# Patient Record
Sex: Female | Born: 1975 | Race: White | Hispanic: No | Marital: Married | Smoking: Former smoker
Health system: Southern US, Community
[De-identification: ages and names within clinical notes are randomized; demographics above are authoritative.]

## PROBLEM LIST (undated history)

## (undated) DIAGNOSIS — R61 Generalized hyperhidrosis: Secondary | ICD-10-CM

## (undated) DIAGNOSIS — B001 Herpesviral vesicular dermatitis: Secondary | ICD-10-CM

## (undated) DIAGNOSIS — N390 Urinary tract infection, site not specified: Secondary | ICD-10-CM

## (undated) HISTORY — DX: Urinary tract infection, site not specified: N39.0

## (undated) HISTORY — DX: Herpesviral vesicular dermatitis: B00.1

## (undated) HISTORY — DX: Generalized hyperhidrosis: R61

## (undated) HISTORY — PX: BREAST EXCISIONAL BIOPSY: SUR124

---

## 2004-02-26 ENCOUNTER — Inpatient Hospital Stay (HOSPITAL_COMMUNITY): Admission: AD | Admit: 2004-02-26 | Discharge: 2004-02-26 | Payer: Self-pay | Admitting: Obstetrics and Gynecology

## 2004-03-03 ENCOUNTER — Inpatient Hospital Stay (HOSPITAL_COMMUNITY): Admission: AD | Admit: 2004-03-03 | Discharge: 2004-03-06 | Payer: Self-pay | Admitting: Obstetrics and Gynecology

## 2004-03-07 ENCOUNTER — Encounter: Admission: RE | Admit: 2004-03-07 | Discharge: 2004-04-06 | Payer: Self-pay | Admitting: Obstetrics and Gynecology

## 2013-01-03 ENCOUNTER — Encounter: Payer: Self-pay | Admitting: Gastroenterology

## 2013-01-22 ENCOUNTER — Ambulatory Visit (INDEPENDENT_AMBULATORY_CARE_PROVIDER_SITE_OTHER): Payer: BC Managed Care – PPO | Admitting: Gastroenterology

## 2013-01-22 ENCOUNTER — Encounter: Payer: Self-pay | Admitting: Gastroenterology

## 2013-01-22 VITALS — BP 110/70 | HR 78 | Ht 65.5 in | Wt 144.1 lb

## 2013-01-22 DIAGNOSIS — Z800 Family history of malignant neoplasm of digestive organs: Secondary | ICD-10-CM

## 2013-01-22 NOTE — Progress Notes (Signed)
HPI: This is a   very pleasant 37 year old woman whom I am meeting for the first time today.  Mother had rectal cancer.  She survived.  She has had no GI symptoms. No concerning changes with bowel.  No overt GI bleeding. Overall her weight has been up a bit.  No abdominal  surgeries  Review of systems: Pertinent positive and negative review of systems were noted in the above HPI section. Complete review of systems was performed and was otherwise normal.    Past Medical History  Diagnosis Date  . UTI (lower urinary tract infection)   . Cold sore   . Night sweats     History reviewed. No pertinent past surgical history.  Current Outpatient Prescriptions  Medication Sig Dispense Refill  . Multiple Vitamin (MULTIVITAMIN) tablet Take 1 tablet by mouth daily.      . Norethindrone-Ethinyl Estradiol-Fe Biphas (LO LOESTRIN FE) 1 MG-10 MCG / 10 MCG tablet Take 1 tablet by mouth daily.       No current facility-administered medications for this visit.    Allergies as of 01/22/2013  . (No Known Allergies)    Family History  Problem Relation Age of Onset  . Rectal cancer Mother 48  . Tuberculosis Mother   . Stomach cancer      maternal ?    History   Social History  . Marital Status: Married    Spouse Name: N/A    Number of Children: 1  . Years of Education: N/A   Occupational History  .     Social History Main Topics  . Smoking status: Former Smoker -- 0.50 packs/day    Types: Cigarettes    Quit date: 07/25/2012  . Smokeless tobacco: Never Used  . Alcohol Use: No     Comment: occasionally  . Drug Use: No  . Sexually Active: Not on file   Other Topics Concern  . Not on file   Social History Narrative  . No narrative on file       Physical Exam: BP 110/70  Pulse 78  Ht 5' 5.5" (1.664 m)  Wt 144 lb 2 oz (65.375 kg)  BMI 23.61 kg/m2 Constitutional: generally well-appearing Psychiatric: alert and oriented x3 Eyes: extraocular movements intact Mouth:  oral pharynx moist, no lesions Neck: supple no lymphadenopathy Cardiovascular: heart regular rate and rhythm Lungs: clear to auscultation bilaterally Abdomen: soft, nontender, nondistended, no obvious ascites, no peritoneal signs, normal bowel sounds Extremities: no lower extremity edema bilaterally Skin: no lesions on visible extremities    Assessment and plan: 37 y.o. female with  elevated risk for colorectal cancer given family history of rectal cancer in mother who was diagnosed in her early 56s.  National guidelines recommend colon cancer screening should start at age 60 or 10 years prior to the diagnosis of colon cancer in her first degree relative, whichever is sooner. She has no GI symptoms that warrant sooner evaluation and she is more than happy to wait until she turns 45 colonoscopy. She knows if she has any significant issues with her about bleeding, significant changes to call sooner than. Otherwise we will put her in our reminder system contact her around age 18.

## 2013-01-22 NOTE — Patient Instructions (Addendum)
You should have screening colonoscopy at age 37 for FH of colon/rectal cancer.  Will put you information in our recall system. If any GI bleeding, significant bowel changes please contact Dr. Christella Hartigan. A copy of this information will be made available to Dr. Vincente Poli.                                               We are excited to introduce MyChart, a new best-in-class service that provides you online access to important information in your electronic medical record. We want to make it easier for you to view your health information - all in one secure location - when and where you need it. We expect MyChart will enhance the quality of care and service we provide.  When you register for MyChart, you can:    View your test results.    Request appointments and receive appointment reminders via email.    Request medication renewals.    View your medical history, allergies, medications and immunizations.    Communicate with your physician's office through a password-protected site.    Conveniently print information such as your medication lists.  To find out if MyChart is right for you, please talk to a member of our clinical staff today. We will gladly answer your questions about this free health and wellness tool.  If you are age 73 or older and want a member of your family to have access to your record, you must provide written consent by completing a proxy form available at our office. Please speak to our clinical staff about guidelines regarding accounts for patients younger than age 83.  As you activate your MyChart account and need any technical assistance, please call the MyChart technical support line at [phone]) or email your question to [email or web address]. If you email your question(s), please include your name, a return phone number and the best time to reach you.  If you have non-urgent health-related questions, you can send a message to our office through  MyChart at [email or web address]. If you have a medical emergency, call 911.  Thank you for using MyChart as your new health and wellness resource!   MyChart licensed from [employer],  4540-9811. Patents Pending.

## 2017-09-25 ENCOUNTER — Encounter: Payer: Self-pay | Admitting: Gastroenterology

## 2017-11-15 ENCOUNTER — Ambulatory Visit: Payer: BC Managed Care – PPO | Admitting: Family Medicine

## 2017-11-15 ENCOUNTER — Encounter: Payer: Self-pay | Admitting: Family Medicine

## 2017-11-15 ENCOUNTER — Telehealth: Payer: Self-pay | Admitting: Family Medicine

## 2017-11-15 ENCOUNTER — Ambulatory Visit
Admission: RE | Admit: 2017-11-15 | Discharge: 2017-11-15 | Disposition: A | Discharge status: Home / Self Care | Payer: BC Managed Care – PPO | Source: Ambulatory Visit | Attending: Family Medicine | Admitting: Family Medicine

## 2017-11-15 VITALS — BP 108/78 | Ht 65.0 in | Wt 145.0 lb

## 2017-11-15 DIAGNOSIS — G8929 Other chronic pain: Secondary | ICD-10-CM

## 2017-11-15 DIAGNOSIS — M25571 Pain in right ankle and joints of right foot: Principal | ICD-10-CM

## 2017-11-15 MED ORDER — MELOXICAM 15 MG PO TABS: 15.0000 mg | ORAL_TABLET | Freq: Every day | ORAL | 0 refills | Status: AC

## 2017-11-15 NOTE — Progress Notes (Signed)
Chief complaint: Right ankle pain 1 week, increase in pain 2 days  History of present illness: Connie Wallace is a 42 year old female who presents to the sports medicine office today with chief complaint of right ankle pain. She reports that symptoms initially started about a week ago. She does not report of any specific inciting incident, trauma, or injury to explain the pain. She reports that initially she started noticing a crunching sensation in her ankle when she walks. She reports noticing it mainly deep inside her ankle joint and on the lateral side. She reports she does work out with a Systems analyst once weekly. No specific increase in activity otherwise. 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 She reports 2 days ago while she was standing up she suddenly noticed a popping sensation in her right ankle, described it as a "pulling apart of guitar strings". She reports immediately feeling pain in her right anterior and lateral ankle and had difficulty bearing weight due to pain. She reports that she tried moving it around for a few minutes and then all of a sudden it "popped back into place". She has noticed slight increased swelling in the right lateral ankle. She does not report of any type of inversion, eversion, dorsiflexion, or plantarflexion trauma. She reports only ankle or foot history she has had the past was base of fifth metatarsal fracture on the right side 10 years ago, reports that she was in a boot for 6 weeks. She does not report of any numbness, tingling, or burning paresthesias. She reports pain on both the lateral and medial side of her right ankle, as well as anterior right ankle over the talus. She reports pain with dorsiflexion, plantar flexion, inversion, and eversion. No previous history of ankle dislocation. She reports that she did see her primary physician yesterday, who kindly referred her here for further  evaluation.  Review of systems:  As stated above  Her past medical history, surgical history, family history, and social history obtained and reviewed. She is otherwise healthy, no medical conditions, no surgeries involving her right foot or ankle, family history is noncontributory to chief complaint, family history of rectal cancer and stomach cancer, she does not report of any current tobacco use.  Physical exam: Vital signs are reviewed and are documented in the chart Gen.: Alert, oriented, appears stated age, in no apparent distress HEENT: Moist oral mucosa Respiratory: Normal respirations, able to speak in full sentences Cardiac: Regular rate, distal pulses 2+ Integumentary: No rashes on visible skin:  Neurologic: Strength 5/5, sensation 2+ in bilateral feet and ankle Psych: Normal affect, mood is described as good Musculoskeletal: Inspection of right foot and ankle reveal no obvious deformity or muscle atrophy, she does have a moderate amount of effusion on the lateral aspect of the right ankle, no warmth, erythema, ecchymosis noted, she is tender multiple areas of the right ankle including the anterior lateral malleolus, anterior ankle over the talus, and over the medial ankle near the posterior tibialis tendon, does have pain with dorsiflexion, plantar flexion, inversion, eversion to does have full range of motion, no signs of ligamentous instability, anterior drawer and talar tilt negative  Limited musculoskeletal ultrasound was performed in the office today of his right foot and ankle. She does have some hypoechoic echogenicity noted along the peroneal tendons right near the lateral malleolus, has also got a notable effusion intra-articular between the tibia and talus,  is also slight amount hypoechogenicity seen in the posterior tibialis tendon near the medial malleolus  Assessment and plan: 1. Acute right ankle pain 2 days 2. History of right base of fifth metatarsal fracture 10  years ago  Plan: Given the diffuse pain that she is having, as well as effusion noted on ultrasound in the tibiotalar joint, question whether her symptoms may be intra-articular. 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 She lacks the trauma that would be expected with a ligament strain, but this very well could be some sort a ligament strain or of the peroneal tendon and/or posterior tibialis tendon. We'll go ahead and order for x-ray of her right ankle for further evaluation of this. We'll obtain 3 views to include AP, lateral, and oblique. I do think we need to calm things down, thus she will be fitted in CAM walker boot for the next week. Additionally, we'll start her on meloxicam 15 mg daily. She will follow-up here in one week for reevaluation or sooner as needed. I will call her after x-ray results.   Haynes Kernshristopher Arnetia Bronk, M.D. Primary Care Sports Medicine Fellow [facility]

## 2017-11-15 NOTE — Addendum Note (Signed)
Addended by: Rutha BouchardBABNIK, Indria Bishara E on: 11/15/2017 01:36 PM   Modules accepted: Orders

## 2017-11-15 NOTE — Telephone Encounter (Signed)
I called Connie Wallace at 1400 on 11/15/17 to relay XR results of her right ankle. There appears to be an area of heterogenous bone density and slight degradation noted in the anterolateral talus. A definite donor site is not observed. I discussed with her that this does raise my concern regarding this being ankle OCD (specifically of the talus) and that next best step would be to go ahead and order a MRI of her right ankle now for further evaluation of this area in the right anterolateral talus. I discussed this does dictate further management in terms of conservative versus arthroscopic repair. She is agreeable to this being done. MRI of right ankle is ordered. She did have a few questions regarding OCD and all questions were satisfactorily answered. She will follow up here in the office after having the MRI. She is still to be in CAM walker boot until that time.  Haynes Kernshristopher Sharel Behne, MD Primary Care Sports Medicine Fellow [facility]

## 2017-11-20 ENCOUNTER — Ambulatory Visit
Admission: RE | Admit: 2017-11-20 | Discharge: 2017-11-20 | Disposition: A | Discharge status: Home / Self Care | Payer: BC Managed Care – PPO | Source: Ambulatory Visit | Attending: Family Medicine | Admitting: Family Medicine

## 2017-11-20 DIAGNOSIS — G8929 Other chronic pain: Secondary | ICD-10-CM

## 2017-11-20 DIAGNOSIS — M25571 Pain in right ankle and joints of right foot: Principal | ICD-10-CM

## 2017-11-22 ENCOUNTER — Ambulatory Visit: Payer: BC Managed Care – PPO | Admitting: Family Medicine

## 2017-11-22 ENCOUNTER — Encounter: Payer: Self-pay | Admitting: Family Medicine

## 2017-11-22 VITALS — BP 102/70 | Ht 65.0 in | Wt 145.0 lb

## 2017-11-22 DIAGNOSIS — G8929 Other chronic pain: Secondary | ICD-10-CM | POA: Diagnosis not present

## 2017-11-22 DIAGNOSIS — M25571 Pain in right ankle and joints of right foot: Secondary | ICD-10-CM

## 2017-11-22 NOTE — Progress Notes (Signed)
Chief complaint: Follow-up of right ankle pain 2 weeks, reviewing MRI results of her right ankle today  History of present illness: Connie Wallace is a 42 year old female who presents to the sports medicine office today for follow-up of right ankle pain 2 weeks. She just recently had an MRI done 2 days ago. At last appointment, she has been and cam walker boot. She reports that symptoms have improved, has not noticed the pain and swelling. She does not report of any interval injury or trauma. No numbness, tingling, or burning paresthesias.  Review of systems:  As stated above  Interval past medical history, surgical history, family history, and social history obtained and unchanged.  Physical exam: Vital signs are reviewed and are documented in the chart Gen.: Alert, oriented, appears stated age, in no apparent distress HEENT: Moist oral mucosa Respiratory: Normal respirations, able to speak in full sentences Cardiac: Regular rate, distal pulses 2+ Integumentary: No rashes on visible skin:  Neurologic: Strength is intact in bilateral ankles and feet, sensation is intact in bilateral ankles and feet Psych: Normal affect, mood is described as good Musculoskeletal: Inspection of right foot and ankle reveal no obvious deformity or muscle atrophy, she effusion size on the lateral aspect of the right ankle has decreased significantly, still trace amount today, no warmth, erythema, or ecchymosis noted, no tenderness today over the talus or posterior tibialis tendon  Assessment and plan: 1. 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 Right posterior tibialis tendonopathy 3. History of right base of fifth metatarsal fracture which has healed  Plan: I did personally review the MRI images with Connie Wallace in the office today. Ultimately, I discussed with her that based on these images she will need to  have ankle arthroscopy. Discussed microfracture possibility and debridement of necrotic bone at time of arthroscopy if it is necessary. I discussed with her that this will probably be a chronic issue for her if this does not get addressed surgically. Discussed referral to one of my surgical partners, Dr[facility] Hurshel [facility]Pete [facility]. He is agreeable to this. Appointment was made for her to see Dr. Jerl Santosalldorf today at 3:15. I also gave her copy of MRI report.   Connie Wallace, M.D. Primary Care Sports Medicine Fellow [facility]

## 2017-11-22 NOTE — Patient Instructions (Signed)
Dr. Myrna Blazeralldorf [facility] [address] [place] [phone]  Appt: 11/22/17 at 3:15 pm, please arrive at 2:45 pm

## 2018-03-01 ENCOUNTER — Other Ambulatory Visit: Payer: Self-pay | Admitting: Obstetrics and Gynecology

## 2018-03-01 DIAGNOSIS — R928 Other abnormal and inconclusive findings on diagnostic imaging of breast: Secondary | ICD-10-CM

## 2018-03-05 ENCOUNTER — Ambulatory Visit
Admission: RE | Admit: 2018-03-05 | Discharge: 2018-03-05 | Disposition: A | Discharge status: Home / Self Care | Payer: BC Managed Care – PPO | Source: Ambulatory Visit | Attending: Obstetrics and Gynecology | Admitting: Obstetrics and Gynecology

## 2018-03-05 ENCOUNTER — Other Ambulatory Visit: Payer: Self-pay | Admitting: Obstetrics and Gynecology

## 2018-03-05 DIAGNOSIS — R928 Other abnormal and inconclusive findings on diagnostic imaging of breast: Secondary | ICD-10-CM

## 2018-03-06 ENCOUNTER — Other Ambulatory Visit: Payer: Self-pay | Admitting: Obstetrics and Gynecology

## 2018-03-06 DIAGNOSIS — R928 Other abnormal and inconclusive findings on diagnostic imaging of breast: Secondary | ICD-10-CM

## 2018-03-23 ENCOUNTER — Ambulatory Visit
Admission: RE | Admit: 2018-03-23 | Discharge: 2018-03-23 | Disposition: A | Discharge status: Home / Self Care | Payer: BC Managed Care – PPO | Source: Ambulatory Visit | Attending: Obstetrics and Gynecology | Admitting: Obstetrics and Gynecology

## 2018-03-23 DIAGNOSIS — R928 Other abnormal and inconclusive findings on diagnostic imaging of breast: Secondary | ICD-10-CM

## 2018-03-23 MED ORDER — GADOBENATE DIMEGLUMINE 529 MG/ML IV SOLN
13.0000 mL | Freq: Once | INTRAVENOUS | Status: AC | PRN
  Administered 2018-03-23: 13 mL via INTRAVENOUS

## 2019-01-31 IMAGING — MG DIGITAL DIAGNOSTIC UNILATERAL LEFT MAMMOGRAM WITH TOMO AND CAD
6 of 10 series · 6 of 30 positions shown · non-contrast
Comparison: Previous exam(s).

CLINICAL DATA: 41-year-old female for evaluation of possible LEFT
breast asymmetry. Please note that on the screening study, this was
inadvertently labeled as of possible RIGHT breast asymmetry.

EXAM:
DIGITAL DIAGNOSTIC LEFT MAMMOGRAM WITH CAD AND TOMO
ULTRASOUND LEFT BREAST

[L XCCL synth-2D (1 of 2)]
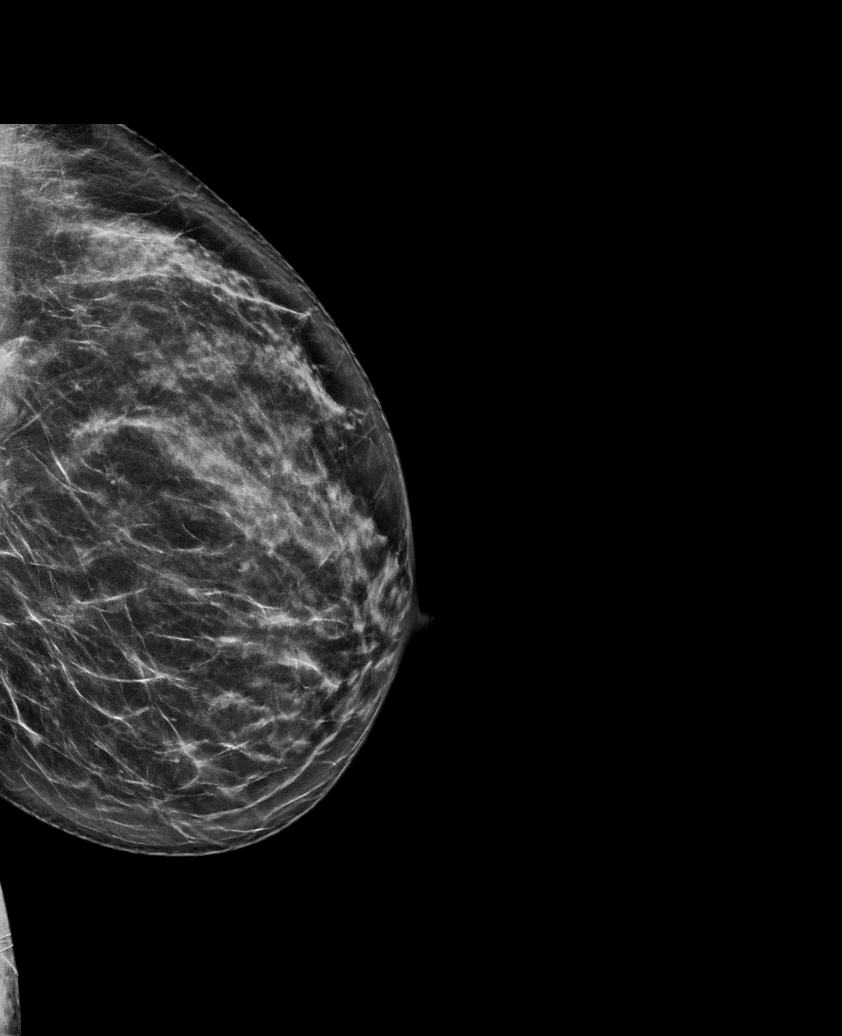

[L XCCM synth-2D]
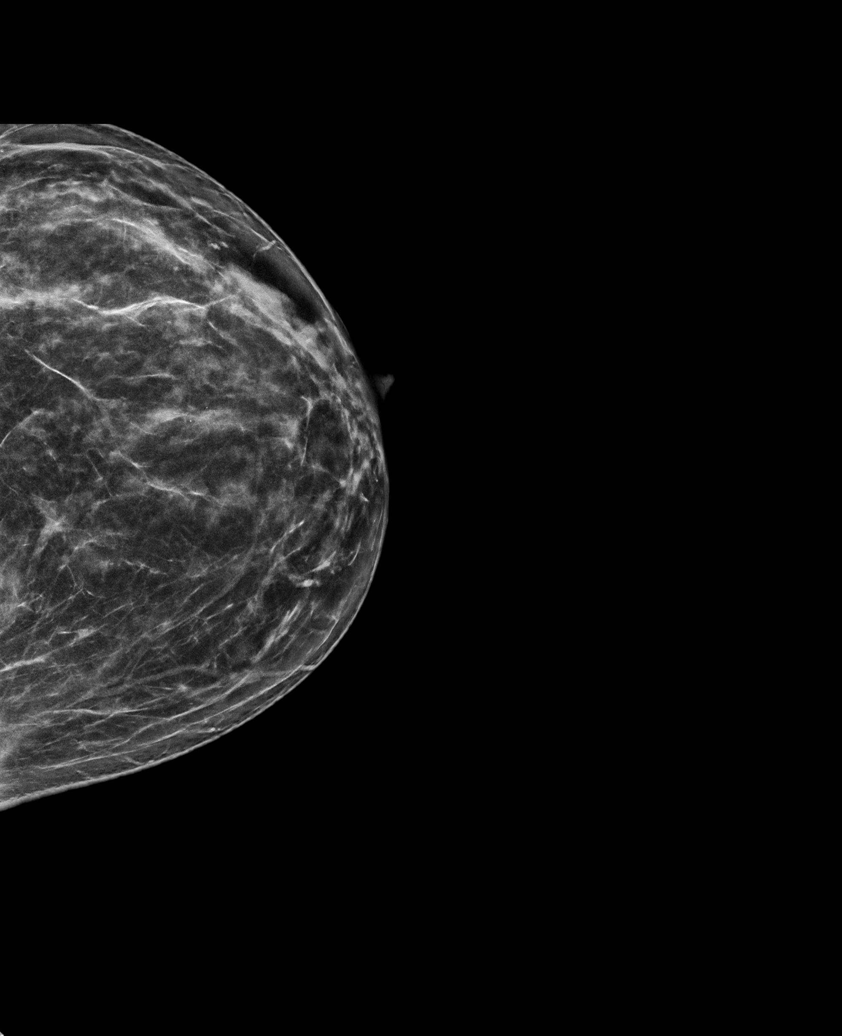

[L ML synth-2D]
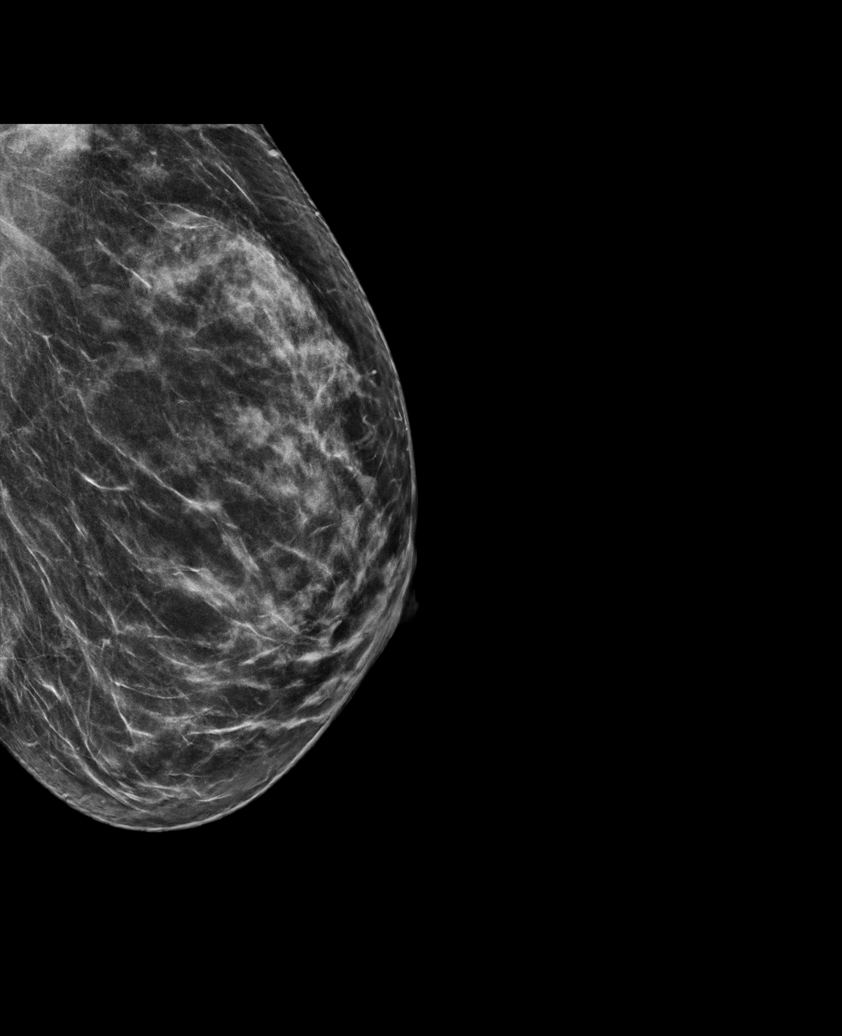

[L MLO synth-2D]
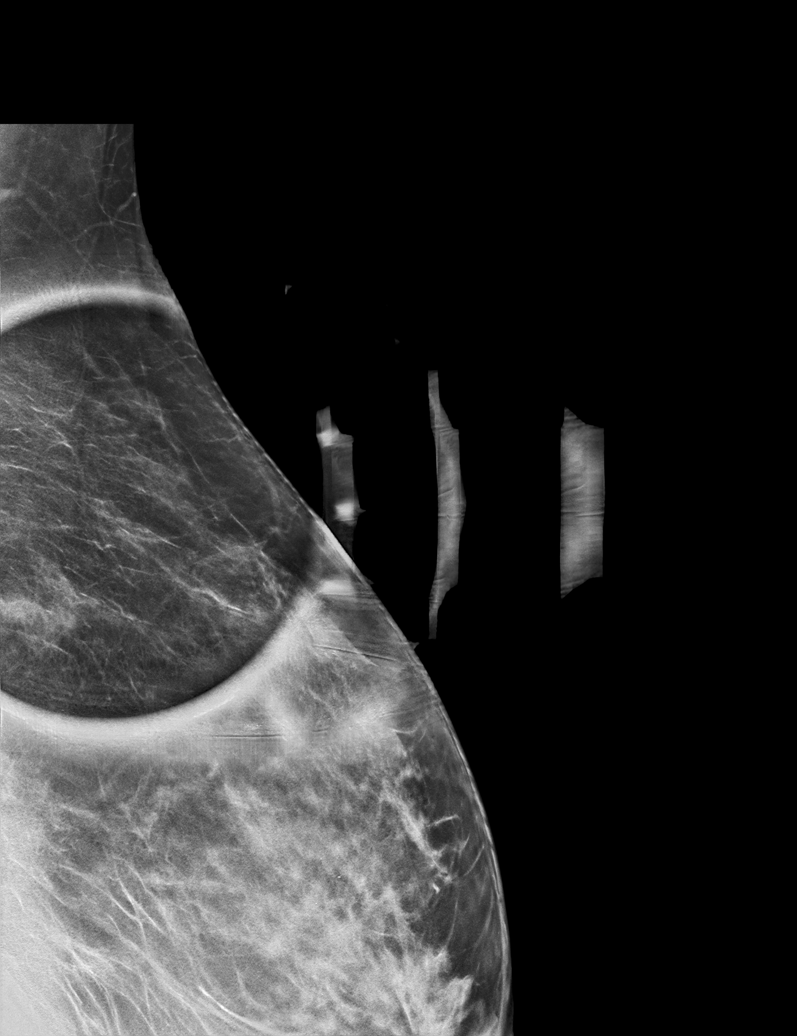

[L XCCL synth-2D (2 of 2)]
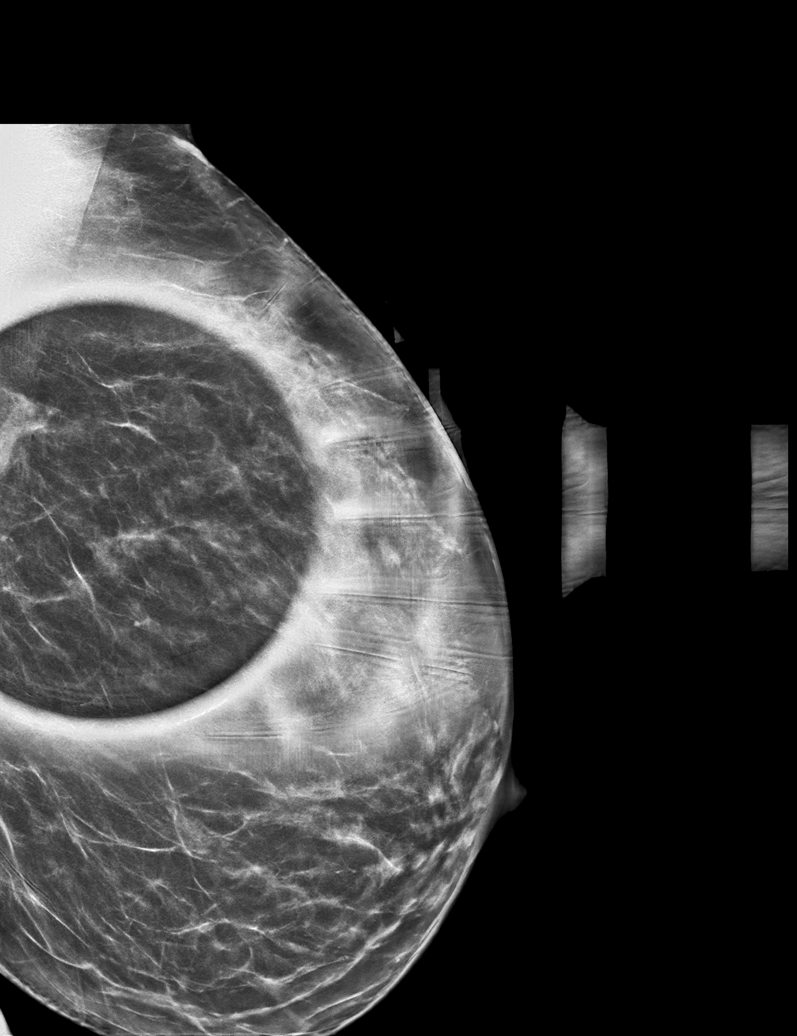

[L MLO tomo · tomo slice 33/66.0]
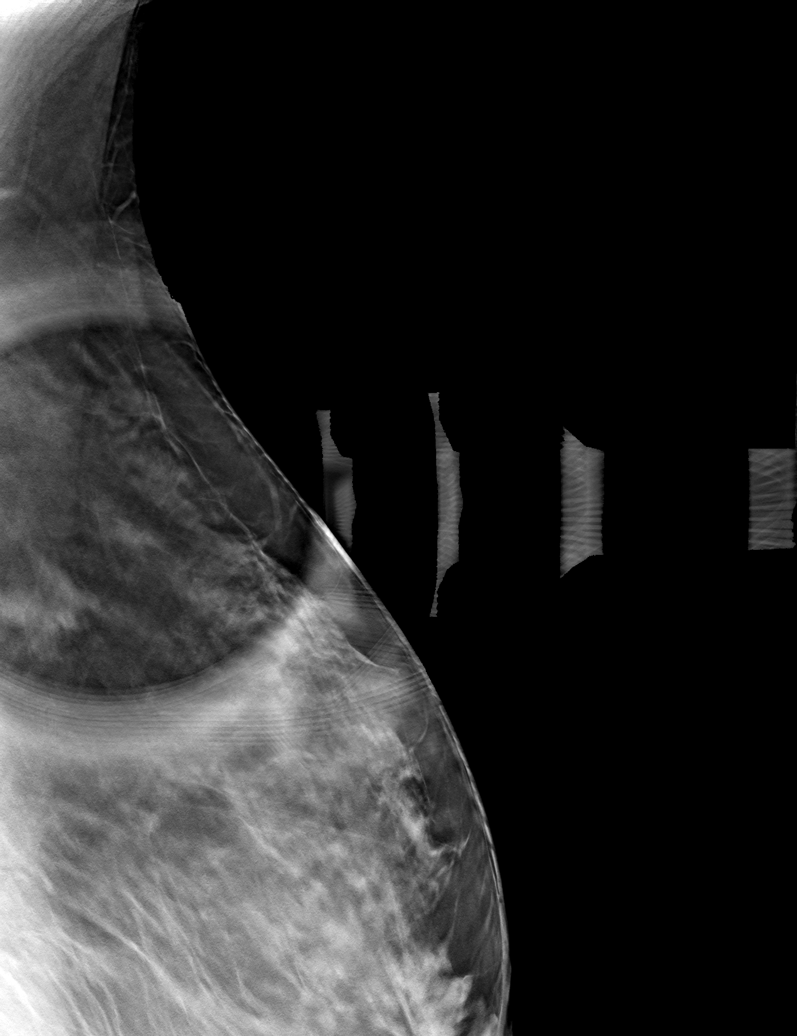

[6 of 30 positions shown; findings below may reference images not displayed]

ACR Breast Density Category b: There are scattered areas of
fibroglandular density.
FINDINGS: 2D/3D full field and spot compression views of the LEFT breast
demonstrate an asymmetry within the posterior UPPER LEFT breast with
equivocal distortion.

Mammographic images were processed with CAD.

On physical exam, palpable thickening throughout the UPPER LEFT
breast identified.

Targeted ultrasound is performed, showing heterogeneously tissue
throughout the UPPER LEFT breast without definite correlate to the
UPPER LEFT breast asymmetry identified mammographically.

No abnormal LEFT axillary lymph nodes are identified.
IMPRESSION: Indeterminate asymmetry within the posterior UPPER LEFT breast with
equivocal distortion, and no definite sonographic correlate. Given
far posterior location, it is felt this would be difficult to sample
stereotactically. MRI recommended for further evaluation.

RECOMMENDATION:
Bilateral breast MRI with/without contrast.

I have discussed the findings and recommendations with the patient.
Results were also provided in writing at the conclusion of the
visit. If applicable, a reminder letter will be sent to the patient
regarding the next appointment.

BI-RADS CATEGORY  4: Suspicious.

## 2019-09-06 ENCOUNTER — Other Ambulatory Visit: Payer: Self-pay

## 2019-09-06 DIAGNOSIS — Z20822 Contact with and (suspected) exposure to covid-19: Secondary | ICD-10-CM

## 2019-09-07 LAB — NOVEL CORONAVIRUS, NAA: SARS-CoV-2, NAA: NOT DETECTED

## 2019-09-12 ENCOUNTER — Other Ambulatory Visit: Payer: Self-pay

## 2019-09-12 DIAGNOSIS — Z20822 Contact with and (suspected) exposure to covid-19: Secondary | ICD-10-CM

## 2019-09-13 LAB — NOVEL CORONAVIRUS, NAA: SARS-CoV-2, NAA: NOT DETECTED

## 2019-12-24 ENCOUNTER — Encounter (HOSPITAL_COMMUNITY): Payer: Self-pay | Admitting: Obstetrics and Gynecology

## 2019-12-24 ENCOUNTER — Other Ambulatory Visit: Payer: Self-pay

## 2019-12-24 ENCOUNTER — Emergency Department (HOSPITAL_COMMUNITY)
Admission: EM | Admit: 2019-12-24 | Discharge: 2019-12-24 | Disposition: A | Discharge status: Home / Self Care | Payer: BC Managed Care – PPO | Attending: Emergency Medicine | Admitting: Emergency Medicine

## 2019-12-24 DIAGNOSIS — Z79899 Other long term (current) drug therapy: Secondary | ICD-10-CM | POA: Insufficient documentation

## 2019-12-24 DIAGNOSIS — T7840XA Allergy, unspecified, initial encounter: Secondary | ICD-10-CM | POA: Diagnosis not present

## 2019-12-24 DIAGNOSIS — R110 Nausea: Secondary | ICD-10-CM | POA: Diagnosis not present

## 2019-12-24 DIAGNOSIS — Z87891 Personal history of nicotine dependence: Secondary | ICD-10-CM | POA: Diagnosis not present

## 2019-12-24 DIAGNOSIS — R220 Localized swelling, mass and lump, head: Secondary | ICD-10-CM | POA: Insufficient documentation

## 2019-12-24 DIAGNOSIS — R519 Headache, unspecified: Secondary | ICD-10-CM | POA: Diagnosis not present

## 2019-12-24 DIAGNOSIS — R21 Rash and other nonspecific skin eruption: Secondary | ICD-10-CM | POA: Diagnosis present

## 2019-12-24 MED ORDER — DIPHENHYDRAMINE HCL 25 MG PO CAPS
50.0000 mg | ORAL_CAPSULE | Freq: Once | ORAL | Status: AC
  Administered 2019-12-24: 50 mg via ORAL
  Filled 2019-12-24: qty 2

## 2019-12-24 MED ORDER — FAMOTIDINE 20 MG PO TABS
40.0000 mg | ORAL_TABLET | Freq: Once | ORAL | Status: AC
  Administered 2019-12-24: 40 mg via ORAL
  Filled 2019-12-24: qty 2

## 2019-12-24 NOTE — Discharge Instructions (Addendum)
Use Benadryl every 4-6 hours as needed for itching or rash. Use Pepcid daily to help with allergic reaction. Make sure stay well-hydrated water. Follow-up with the allergist listed below as needed for further evaluation of possible allergic reaction. Return to the emergency room immediately if you develop any swelling of your mouth, tongue, or lips.  Return with any new, worsening, or concerning symptoms.

## 2019-12-24 NOTE — ED Triage Notes (Signed)
Patient reports she was eating dinner and her lips started feeling itchy and she broke out in a rash all over her face neck and chest. Patient reports nothing like this has ever happened before. Patient speaking in full sentences

## 2019-12-24 NOTE — ED Provider Notes (Signed)
[name] DEPT Provider Note   CSN: [id number] Arrival date & time: 12/24/19  2022     History Chief Complaint  Patient presents with  . Rash    Connie Wallace is a 44 y.o. female presenting for evaluation of rash and lip swelling.  Patient states around 7:00 tonight she was eating dinner when she felt her lips tingling.  After cleaning the dishes, patient noticed that she was extremely flushed, mostly in her face and upper extremities.  She took 2 ibuprofen without significant improvement of her symptoms.  She has not tried anything else.  Patient is concerned she is having allergic reaction.  She reports some mild nausea which has since resolved.  She reports she was having a severe headache, but this too has resolved.  She denies mouth, tongue, or throat swelling.  She denies shortness of breath or abdominal pain.  She denies vomiting.  She reports no previous history of allergy.  She started progesterone several months ago, but no other recent medications.  She denies recent change in soaps, detergents, hair products, environments, or clothes.  HPI     Past Medical History:  Diagnosis Date  . Cold sore   . Night sweats   . UTI (lower urinary tract infection)     There are no problems to display for this patient.   Past Surgical History:  Procedure Laterality Date  . BREAST EXCISIONAL BIOPSY Right    more of skin issue than breast at nipple     OB History   No obstetric history on file.     Family History  Problem Relation Age of Onset  . Rectal cancer Mother 28  . Tuberculosis Mother   . Stomach cancer Other        maternal ?    Social History   Tobacco Use  . Smoking status: Former Smoker    Packs/day: 0.50    Types: Cigarettes    Quit date: 07/25/2012    Years since quitting: 7.4  . Smokeless tobacco: Never Used  Substance Use Topics  . Alcohol use: No    Comment: occasionally  . Drug use: No    Home  Medications Prior to Admission medications   Medication Sig Start Date End Date Taking? Authorizing Provider  ALPRAZolam Duanne Moron) 0.5 MG tablet Take 0.5 mg by mouth 3 (three) times daily as needed for anxiety.  11/21/19  Yes [provider]  busPIRone (BUSPAR) 7.5 MG tablet Take 7.5 mg by mouth 2 (two) times daily. 12/22/19  Yes [provider]  levonorgestrel (MIRENA) 20 MCG/24HR IUD by Intrauterine route.   Yes [provider]  meloxicam (MOBIC) 15 MG tablet Take 1 tablet (15 mg total) by mouth daily. Patient taking differently: Take 15 mg by mouth daily as needed.  11/15/17  Yes Lake, Christoper P, MD  Misc Natural Products (ADRENAL) CAPS Take 2 capsules by mouth in the morning and at bedtime.   Yes [provider]    Allergies    Patient has no known allergies.  Review of Systems   Review of Systems  HENT:       Tingling lip  Gastrointestinal: Positive for nausea (resolved).  Skin: Positive for rash.  Neurological: Positive for headaches (resolved).  All other systems reviewed and are negative.   Physical Exam Updated Vital Signs BP (!) 143/95 (BP Location: Left Arm)   Pulse (!) 114   Temp 98.1 F (36.7 C) (Oral)   Resp 18  LMP 12/10/2019 (Approximate)   SpO2 100%   Physical Exam Vitals and nursing note reviewed.  Constitutional:      General: She is not in acute distress.    Appearance: She is well-developed.     Comments: Appears anxious, but otherwise nontoxic  HENT:     Head: Normocephalic and atraumatic.     Mouth/Throat:     Comments: ?mild swelling on inner lower lip. No tongue or throat swelling.  Handling secretions easily.  Airway intact. Eyes:     Conjunctiva/sclera: Conjunctivae normal.     Pupils: Pupils are equal, round, and reactive to light.  Cardiovascular:     Rate and Rhythm: Regular rhythm. Tachycardia present.     Pulses: Normal pulses.     Comments: Heart rate around 110 on my exam.  Patient is  anxious Pulmonary:     Effort: Pulmonary effort is normal. No respiratory distress.     Breath sounds: Normal breath sounds. No wheezing.     Comments: Clear lung sounds in all fields Abdominal:     General: There is no distension.     Palpations: Abdomen is soft. There is no mass.     Tenderness: There is no abdominal tenderness. There is no guarding or rebound.  Musculoskeletal:        General: Normal range of motion.     Cervical back: Normal range of motion and neck supple.  Skin:    General: Skin is warm and dry.     Capillary Refill: Capillary refill takes less than 2 seconds.     Findings: Erythema present.     Comments: Skin is very erythematous.  This is most notable in the face, upper chest, and upper extremities.  No obvious urticaria.  Neurological:     Mental Status: She is alert and oriented to person, place, and time.  Psychiatric:        Mood and Affect: Mood is anxious.     ED Results / Procedures / Treatments   Labs (all labs ordered are listed, but only abnormal results are displayed) Labs Reviewed - No data to display  EKG None  Radiology No results found.  Procedures Procedures (including critical care time)  Medications Ordered in ED Medications  diphenhydrAMINE (BENADRYL) capsule 50 mg (50 mg Oral Given 12/24/19 2109)  famotidine (PEPCID) tablet 40 mg (40 mg Oral Given 12/24/19 2109)    ED Course  I have reviewed the triage vital signs and the nursing notes.  Pertinent labs & imaging results that were available during my care of the patient were reviewed by me and considered in my medical decision making (see chart for details).    MDM Rules/Calculators/A&P                      Patient presenting for evaluation of possible allergic reaction.  On exam, patient's skin is erythematous. ? Mild swelling of the inner bottom lip.  Airway is intact.  No signs of respiratory distress.  She is mildly tachycardic, consider anxiety versus allergic rxn.   Patient is not sure she wants to or needs to be here.  Discussed importance of monitoring for at least a limited period of time.  Will give Benadryl and Pepcid p.o. in the meantime.  On reassessment about 30 minutes after medications, patient reports no improvement and no worsening. Hr is improved on my exam, 99 in the room. She states she continues to feel she is breathing well, does not feel  she needs to be in the ER any longer.  I discussed ideal situation of continued monitoring until symptoms are improving, patient declines.  She would like to go home.  I discussed risks including worsening symptoms, potentially leading to anaphylaxis and death.  Patient states she understands, but would like to leave.  Patient will monitor her symptoms at home, and return with any worsening.  Discussed use of Benadryl and Pepcid at home as needed.  Discussed follow-up with allergist as needed.  At this time, patient appears safe for discharge.  Return precautions given.  Patient states she understands and agrees plan.  Final Clinical Impression(s) / ED Diagnoses Final diagnoses:  Allergic reaction, initial encounter    Rx / DC Orders ED Discharge Orders    None       Alveria Apley, PA-C 12/24/19 2148    Derwood Kaplan, MD 12/27/19 [phone]
